# Patient Record
Sex: Female | Born: 1985 | Race: Black or African American | Hispanic: No | Marital: Single | Smoking: Never smoker
Health system: Southern US, Community
[De-identification: ages and names within clinical notes are randomized; demographics above are authoritative.]

## PROBLEM LIST (undated history)

## (undated) DIAGNOSIS — F419 Anxiety disorder, unspecified: Secondary | ICD-10-CM

## (undated) DIAGNOSIS — I10 Essential (primary) hypertension: Secondary | ICD-10-CM

## (undated) HISTORY — DX: Anxiety disorder, unspecified: F41.9

## (undated) HISTORY — PX: CHOLECYSTECTOMY: SHX55

## (undated) HISTORY — PX: BREAST SURGERY: SHX581

## (undated) HISTORY — DX: Essential (primary) hypertension: I10

---

## 2010-01-13 ENCOUNTER — Emergency Department (HOSPITAL_COMMUNITY): Admission: EM | Admit: 2010-01-13 | Discharge: 2010-01-14 | Payer: Self-pay | Admitting: Emergency Medicine

## 2010-03-11 ENCOUNTER — Ambulatory Visit
Admission: RE | Admit: 2010-03-11 | Discharge: 2010-03-12 | Payer: Self-pay | Source: Home / Self Care | Attending: Specialist | Admitting: Specialist

## 2010-05-02 ENCOUNTER — Ambulatory Visit (HOSPITAL_COMMUNITY)
Admission: EM | Admit: 2010-05-02 | Discharge: 2010-05-03 | Disposition: A | Discharge status: Home / Self Care | Payer: BC Managed Care – PPO | Source: Ambulatory Visit | Attending: General Surgery | Admitting: General Surgery

## 2010-05-02 ENCOUNTER — Emergency Department (HOSPITAL_COMMUNITY): Payer: BC Managed Care – PPO

## 2010-05-02 ENCOUNTER — Other Ambulatory Visit: Payer: Self-pay | Admitting: General Surgery

## 2010-05-02 DIAGNOSIS — Z01812 Encounter for preprocedural laboratory examination: Secondary | ICD-10-CM | POA: Insufficient documentation

## 2010-05-02 DIAGNOSIS — I10 Essential (primary) hypertension: Secondary | ICD-10-CM | POA: Insufficient documentation

## 2010-05-02 DIAGNOSIS — K810 Acute cholecystitis: Secondary | ICD-10-CM | POA: Insufficient documentation

## 2010-05-02 LAB — CBC
HCT: 38.7 % (ref 36.0–46.0)
Hemoglobin: 12.7 g/dL (ref 12.0–15.0)
MCH: 26.6 pg (ref 26.0–34.0)
MCHC: 32.8 g/dL (ref 30.0–36.0)
MCV: 81.1 fL (ref 78.0–100.0)
RDW: 12.9 % (ref 11.5–15.5)

## 2010-05-02 LAB — URINE MICROSCOPIC-ADD ON

## 2010-05-02 LAB — COMPREHENSIVE METABOLIC PANEL
ALT: 21 U/L (ref 0–35)
AST: 19 U/L (ref 0–37)
Albumin: 3.6 g/dL (ref 3.5–5.2)
Calcium: 9.1 mg/dL (ref 8.4–10.5)
Chloride: 103 mEq/L (ref 96–112)
Creatinine, Ser: 0.8 mg/dL (ref 0.4–1.2)
GFR calc Af Amer: >60 mL/min (ref >60)
Sodium: 136 mEq/L (ref 135–145)
Total Bilirubin: 0.4 mg/dL (ref 0.3–1.2)

## 2010-05-02 LAB — URINALYSIS, ROUTINE W REFLEX MICROSCOPIC
Bilirubin Urine: NEGATIVE
Hgb urine dipstick: NEGATIVE
Ketones, ur: 15 mg/dL — AB
Protein, ur: NEGATIVE mg/dL
Urine Glucose, Fasting: NEGATIVE mg/dL

## 2010-05-02 LAB — DIFFERENTIAL
Eosinophils Relative: 2 % (ref 0–5)
Lymphocytes Relative: 27 % (ref 12–46)
Lymphs Abs: 3.2 10*3/uL (ref 0.7–4.0)
Monocytes Absolute: 0.8 10*3/uL (ref 0.1–1.0)
Monocytes Relative: 7 % (ref 3–12)
Neutro Abs: 7.7 10*3/uL (ref 1.7–7.7)

## 2010-05-13 NOTE — Op Note (Signed)
NAMEVIOLET, SEABURY          ACCOUNT NO.:  [id number]  MEDICAL RECORD NO.:  [id number]           PATIENT TYPE:  I  LOCATION:  5124                         FACILITY:  [facility]  PHYSICIAN:  Mary Sella. Andrey Campanile, MD     DATE OF BIRTH:  12-19-1985  DATE OF PROCEDURE:  05/02/2010 DATE OF DISCHARGE:                              OPERATIVE REPORT   PREOPERATIVE DIAGNOSIS:  Acute cholecystitis.  POSTOPERATIVE DIAGNOSIS:  Acute cholecystitis.  PROCEDURE:  Laparoscopic cholecystectomy with intraoperative cholangiogram.  SURGEON:  Mary Sella. Andrey Campanile, MD  ASSISTANT SURGEON:  None.  ANESTHESIA:  General plus local consisting of 0.25% Marcaine with epi.  SPECIMEN:  Gallbladder.  ESTIMATED BLOOD LOSS:  Minimal.  FINDINGS:  The patient had acutely inflamed, thick-walled gallbladder. I was actually unable to aspirate it, the wall was so thick.  The critical view was obtained.  The cholangiogram demonstrated prompt filling of the cystic common bile duct, common hepatic duct, left to right hepatic duct, and prompt emptying into the duodenum.  There was no filling defect.  Because there was a little bit of oozing from the gallbladder fossa, which was controlled with electrocautery, I did elect to leave a piece of surgical snow in the gallbladder fossa.  INDICATIONS FOR PROCEDURE:  The patient is a 25 year old African female with known gallstones.  He had actually been seen in our office back in the fall, but delayed having surgery due to bilateral breast reduction. She developed right upper quadrant and epigastric pain on Sunday evening and some nausea, however, her pain resolved.  She was able to tolerate a diet on Monday and Tuesday; however, she said she had reflux. Apparently the pain came back on late yesterday evening and was persistent and had severe nausea.  Her pain did not go away after several hours, so it prompted her to come to the emergency room.  She denied any fevers or chills.   She had a mild white blood cell count, but her LFTs were normal.  There is no evidence of choledocholithiasis.  I recommended a laparoscopic cholecystectomy with cholangiogram, possible open.  We discussed the risks and benefits of surgery including bleeding, infection, injury to surrounding structures, prolonged diarrhea, need for an open procedure, bile leak, incisional hernia, DVT occurrence, and injury to common bile duct requiring major reconstructive bile duct surgery.  She elects to proceed to surgery.  DESCRIPTION OF PROCEDURE:  After obtaining informed consent, the patient was brought to the operating room, placed supine on the operating room table.  General endotracheal anesthesia was established.  Sequential compression devices were placed.  She received antibiotics prior to skin incision.  A surgical time-out was performed.  Her abdomen was then prepped and draped in usual standard surgical fashion.  Local was infiltrated at the base of her umbilicus.  Next, an 1-inch infraumbilical incision was made with a #11 blade.  The subcutaneous tissue was spread.  The fascia was grasped with Kochers x2 and lifted anteriorly.  The fascia was incised with #11 blade.  The abdominal cavity was entered.  A pursestring suture consisting of 0-Vicryl on UR-6 needle was placed around the fascial edges.  Next, the Hasson trocar was placed under direct visualization into the abdominal cavity. Pneumoperitoneum was smoothly established up to a patient pressure of 15 mmHg.  Laparoscope was advanced and the abdominal cavity was surveilled. She had a few omental adhesions in her left upper quadrant to her abdominal wall.  I elected we would leave these alone.  She was then placed in reverse Trendelenburg and rotated slightly to the left.  Three 5-mm trocars were placed, one in the subxiphoid and two in the right hypochondrium; all under direct visualization after local had been infiltrated.  She had  some omentum, which was adhered to the body of her gallbladder.  This was stripped away with both blunt dissection as well as with hook electrocautery.  The gallbladder was very thick-walled and edematous.  It was actually somewhat difficult to grasp and retracted up towards the right shoulder.  I did try to aspirate it twice, but was unsuccessful in piercing the gallbladder wall.  Nonetheless, I was able to get a ratcheted grasper on it and lifted up to the right shoulder. The neck was grasped and retracted laterally.  I then used hook electrocautery to incise the peritoneum both medially and laterally to help with retraction and visualization.  The cystic duct was circumferentially dissected around with a Catha Nottingham and the cystic artery was also circumferentially dissected out around.  Some overlying inflamed peritoneum was stripped down with the suction irrigator catheter as well as with the Teaching laboratory technician.  The critical view was obtained.  I did see the liver between the cystic duct and the cystic artery.  One clip was placed on the cystic duct distally.  It was then partially transected with Endoshears.  The cholangiogram catheter was introduced through the abdominal wall and secured into the duct with a clip.  The cholangiogram was performed as described above.  Once this was done, the C-arm machine was removed, the clip was removed from the cystic duct, and the cholangiogram catheter was removed.  Three clips were placed on the downside the cystic duct and then it was transected with Endoshears.  Two clips were placed on the downside of the cystic artery and one distally.  It was then transected with Endoshears.  We then rolled the gallbladder up at the gallbladder fossa.  This was done using hook electrocautery.  The liver capsule was violated a couple of times because there was a really not a too good of a plane between the posterior wall of the gallbladder and the  liver.  However, we were eventually able to free the gallbladder from the gallbladder fossa. Care was placed in the subxiphoid trocar.  The endobag was advanced through the umbilical trocar.  The specimen was placed in the endobag and it was brought out through the umbilical trocar site. Pneumoperitoneum was reestablished.  The liver was lifted up. Hemostasis was achieved with electrocautery.  I irrigated the right upper quadrant with 1 L saline.  There was no evidence of bile leak.  There is no evidence of ongoing bleeding.  I did elect to place a piece of surgical snow.  The camera was placed in the subxiphoid trocar and the umbilical trocar was removed.  The previously placed pursestring suture was tied down thus obliterating the fascial defect.  There was no air leak.  There was nothing within our fascial closure.  Pneumoperitoneum was released and all trocars were removed.  Skin incisions were closed with 4-0 Monocryl in a subcuticular fashion followed by the  application of Octylseal adhesive.  The patient was extubated and taken to the recovery room in a stable condition.  There were no immediate complications.  The patient tolerated the procedure well.  All needle and sponge counts were correct x2.     Mary Sella. Andrey Campanile, MDEMW/MEDQ  D:  05/02/2010  T:  05/03/2010  Job:  [id number]  cc:   Lennie Muckle, MD  Electronically Signed by Gaynelle Adu M.D. on 05/13/2010 08:34:43 AM

## 2010-06-02 NOTE — H&P (Signed)
[name], [name]          ACCOUNT NO.:  [id number]  MEDICAL RECORD NO.:  [id number]           PATIENT TYPE:  I  LOCATION:  5124                         FACILITY:  [facility]  PHYSICIAN:  Mary Sella. Andrey Campanile, MD     DATE OF BIRTH:  December 19, 1985  DATE OF ADMISSION:  05/02/2010 DATE OF DISCHARGE:                             HISTORY & PHYSICAL   PRIMARY CARE PHYSICIAN:  Renaye Rakers, MD  CHIEF COMPLAINT:  Abdominal pain.  HISTORY OF PRESENT ILLNESS:  Cynthia Vance is a very pleasant 25 year old black female with a history of hypertension and genital herpes simplex who had a gallbladder attack in October 2011.  She presented to the emergency department and had ultrasound which revealed gallstones.  She was sent to our office for follow up.  She did see Dr. Freida Busman in our office and it was recommended at that time that she have surgery; however, it was agreed upon to wait as she was having a breast reduction in December.  She did have this done and this past Sunday the patient said she had another gallbladder attack.  She says she has felt "weird" since with some dizziness and just an altered sense of being.  She did, however, improve.  However, on Wednesday she began having some right upper quadrant pain which radiated through to her back.  She also developed associated nausea and some emesis.  This has been her biggest complaint, persistent nausea.  She presented back to the emergency department overnight, at which time she had an ultrasound which revealed cholelithiasis with gallbladder wall thickening and presence of a sonographic Murphy sign.  No biliary dilatation was seen.  She was also found to have a white blood cell count of 11,900; however, all of her LFTs were normal.  At this time, we were asked to evaluate the patient for surgical admission.  REVIEW OF SYSTEMS:  Please see HPI, otherwise all other systems have been reviewed and are negative.  PAST MEDICAL HISTORY: 1.  Hypertension. 2. Genital herpes.  PAST SURGICAL HISTORY:  Breast reduction.  SOCIAL HISTORY:  The patient is single.  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  She denies any tobacco and rarely consumes any alcohol.  Otherwise, no illicit drug abuse.  ALLERGIES:  NKDA.  Medications include: 1. Lisinopril/hydrochlorothiazide 20/12.5 mg daily. 2. Valtrex.  PHYSICAL EXAMINATION:  GENERAL:  Cynthia Vance is a very pleasant 25 year old black female who is currently lying in bed in no acute distress but otherwise well developed and well nourished. HEENT:  Head is normocephalic, atraumatic.  Sclerae noninjected.  Pupils are equal, round, and reactive to light.  Ears and nose without any obvious masses or lesions.  No rhinorrhea.  Mouth is pink.  Throat shows no exudate. HEART:  Regular rate and rhythm.  Normal S1 and S2.  No murmurs, gallops, or rubs are noted.  She does have palpable carotid, radial, and pedal pulses bilaterally. LUNGS:  Clear to auscultation bilaterally with no wheezes, rhonchi, or rales noted.  Respiratory effort is nonlabored. ABDOMEN:  Soft, tender in the right upper quadrant with a positive Murphy sign.  She has hypoactive bowel sounds and is otherwise nondistended.  She does not have any hernias, masses, or organomegaly noted. MUSCULOSKELETAL:  All four extremities are symmetrical with no cyanosis, clubbing, or edema. NEUROLOGIC:  Cranial nerves II through XII appear to be grossly intact. Deep tendon reflex exam is deferred at this time. PSYCHIATRIC:  The patient was alert and oriented x3 with an appropriate affect.  LABORATORY DATA AND DIAGNOSTICS:  Sodium 136, potassium 3.4, glucose 133, BUN 11, creatinine 0.80, total bilirubin 0.4, alkaline phosphatase 44, AST 19, ALT 21, lipase is 21.  White blood cell count 11,900, hemoglobin 12.7, hematocrit 38.7, platelet count is 286,000.  Ultrasound reveals cholelithiasis with  gallbladder wall thickening and a positive sonographic Murphy sign consistent with acute cholecystitis.  IMPRESSION: 1. Acute cholecystitis. 2. History of hypertension. 3. History of genital herpes.  PLANS:  At this time, we will get the patient admitted.  We will start her on IV fluids as well as IV Unasyn.  We will plan for a laparoscopic cholecystectomy today by Dr. Andrey Campanile.  I have explained the procedure to the patient as well as potential risks and complications including bleeding, infection, common duct injury, or injury to surrounding bowel or organs.  I have discussed the complications including a bile leak as well.  The patient and her mom who is present with her understand and wish to proceed.     Letha Cape, PA   ______________________________ Mary Sella. Andrey Campanile, MD    KEO/MEDQ  D:  05/02/2010  T:  05/02/2010  Job:  [id number]  cc:   Renaye Rakers, M.D.  Electronically Signed by Barnetta Chapel PA on 05/17/2010 07:06:37 AM Electronically Signed by Gaynelle Adu M.D. on 06/02/2010 09:34:38 AM

## 2010-06-03 LAB — DIFFERENTIAL
Basophils Absolute: 0 K/uL (ref 0.0–0.1)
Basophils Relative: 0 % (ref 0–1)
Eosinophils Absolute: 0.2 K/uL (ref 0.0–0.7)
Eosinophils Relative: 2 % (ref 0–5)
Lymphocytes Relative: 48 % — ABNORMAL HIGH (ref 12–46)
Lymphs Abs: 3.3 K/uL (ref 0.7–4.0)
Monocytes Absolute: 0.6 K/uL (ref 0.1–1.0)
Monocytes Relative: 9 % (ref 3–12)
Neutro Abs: 2.8 K/uL (ref 1.7–7.7)
Neutrophils Relative %: 41 % — ABNORMAL LOW (ref 43–77)

## 2010-06-03 LAB — BASIC METABOLIC PANEL WITH GFR
BUN: 7 mg/dL (ref 6–23)
CO2: 26 meq/L (ref 19–32)
Calcium: 9 mg/dL (ref 8.4–10.5)
Chloride: 106 meq/L (ref 96–112)
Creatinine, Ser: 0.65 mg/dL (ref 0.4–1.2)
GFR calc Af Amer: >60 mL/min (ref >60)
GFR calc non Af Amer: >60 mL/min (ref >60)
Glucose, Bld: 108 mg/dL — ABNORMAL HIGH (ref 70–99)
Potassium: 4.1 meq/L (ref 3.5–5.1)
Sodium: 137 meq/L (ref 135–145)

## 2010-06-03 LAB — CBC
Hemoglobin: 13.1 g/dL (ref 12.0–15.0)
MCH: 26 pg (ref 26.0–34.0)
MCV: 82.1 fL (ref 78.0–100.0)
RBC: 5.04 MIL/uL (ref 3.87–5.11)

## 2010-06-05 LAB — COMPREHENSIVE METABOLIC PANEL
Albumin: 3.8 g/dL (ref 3.5–5.2)
BUN: 8 mg/dL (ref 6–23)
Chloride: 101 mEq/L (ref 96–112)
Creatinine, Ser: 0.84 mg/dL (ref 0.4–1.2)
Total Bilirubin: 0.4 mg/dL (ref 0.3–1.2)
Total Protein: 7.6 g/dL (ref 6.0–8.3)

## 2010-06-05 LAB — CBC
MCH: 27 pg (ref 26.0–34.0)
MCV: 81.9 fL (ref 78.0–100.0)
Platelets: 296 10*3/uL (ref 150–400)
RDW: 13 % (ref 11.5–15.5)

## 2010-06-05 LAB — URINALYSIS, ROUTINE W REFLEX MICROSCOPIC
Hgb urine dipstick: NEGATIVE
Nitrite: NEGATIVE
Specific Gravity, Urine: 1.019 (ref 1.005–1.030)
Urobilinogen, UA: 1 mg/dL (ref 0.0–1.0)

## 2010-06-05 LAB — DIFFERENTIAL
Basophils Absolute: 0 10*3/uL (ref 0.0–0.1)
Lymphocytes Relative: 39 % (ref 12–46)
Monocytes Absolute: 0.7 10*3/uL (ref 0.1–1.0)
Neutro Abs: 7.1 10*3/uL (ref 1.7–7.7)

## 2010-06-05 LAB — POCT PREGNANCY, URINE: Preg Test, Ur: NEGATIVE

## 2010-06-05 LAB — GC/CHLAMYDIA PROBE AMP, GENITAL: GC Probe Amp, Genital: NEGATIVE

## 2010-06-05 LAB — LIPASE, BLOOD: Lipase: 23 U/L (ref 11–59)

## 2010-07-17 ENCOUNTER — Other Ambulatory Visit: Payer: Self-pay | Admitting: Obstetrics & Gynecology

## 2010-09-30 ENCOUNTER — Ambulatory Visit
Admission: RE | Admit: 2010-09-30 | Discharge: 2010-09-30 | Disposition: A | Discharge status: Home / Self Care | Payer: BC Managed Care – PPO | Source: Ambulatory Visit | Attending: Family Medicine | Admitting: Family Medicine

## 2010-09-30 ENCOUNTER — Other Ambulatory Visit: Payer: Self-pay | Admitting: Family Medicine

## 2010-09-30 DIAGNOSIS — T148XXA Other injury of unspecified body region, initial encounter: Secondary | ICD-10-CM

## 2011-06-16 IMAGING — RF DG CHOLANGIOGRAM OPERATIVE
1 series · 5 of 5 positions shown · non-contrast
Comparison: Ultrasound 05/02/2010

CLINICAL DATA: Laparoscopic cholecystectomy

INTRAOPERATIVE CHOLANGIOGRAM
TECHNIQUE: Cholangiographic images from the C-arm fluoroscopic
device were submitted for interpretation post-operatively.  Please
see the procedural report for the amount of contrast and the
fluoroscopy time utilized.

[Series 1: run · 2 acquisitions, 5 frames shown]
[im 1/2]
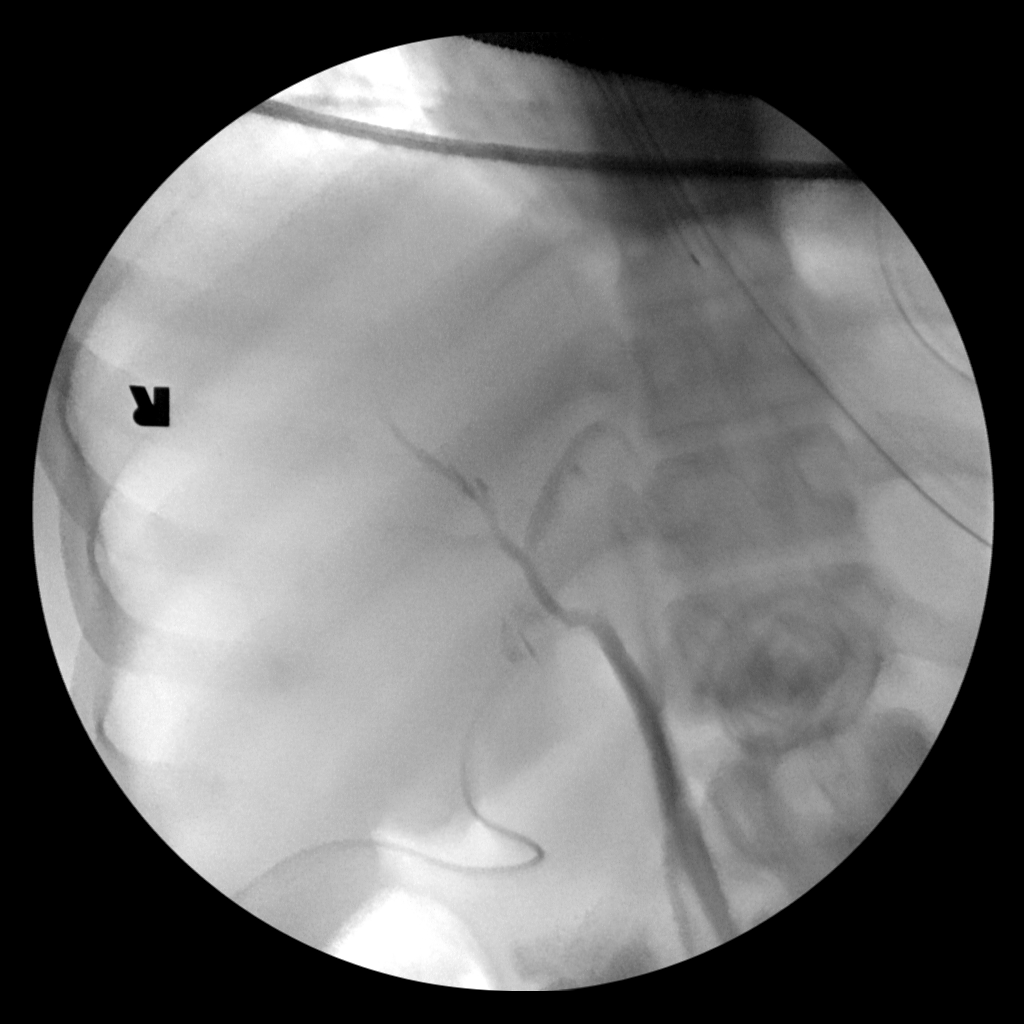
[im 1/2]
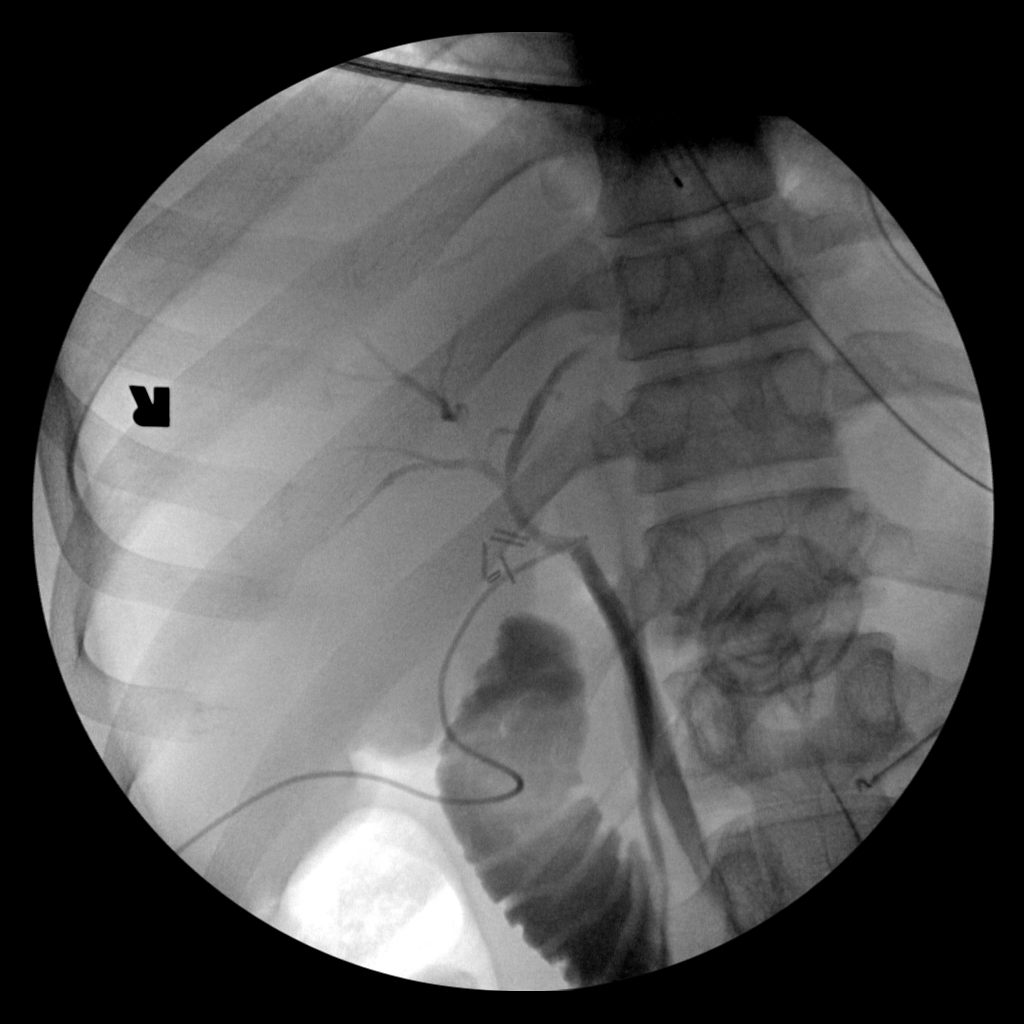
[im 1/2]
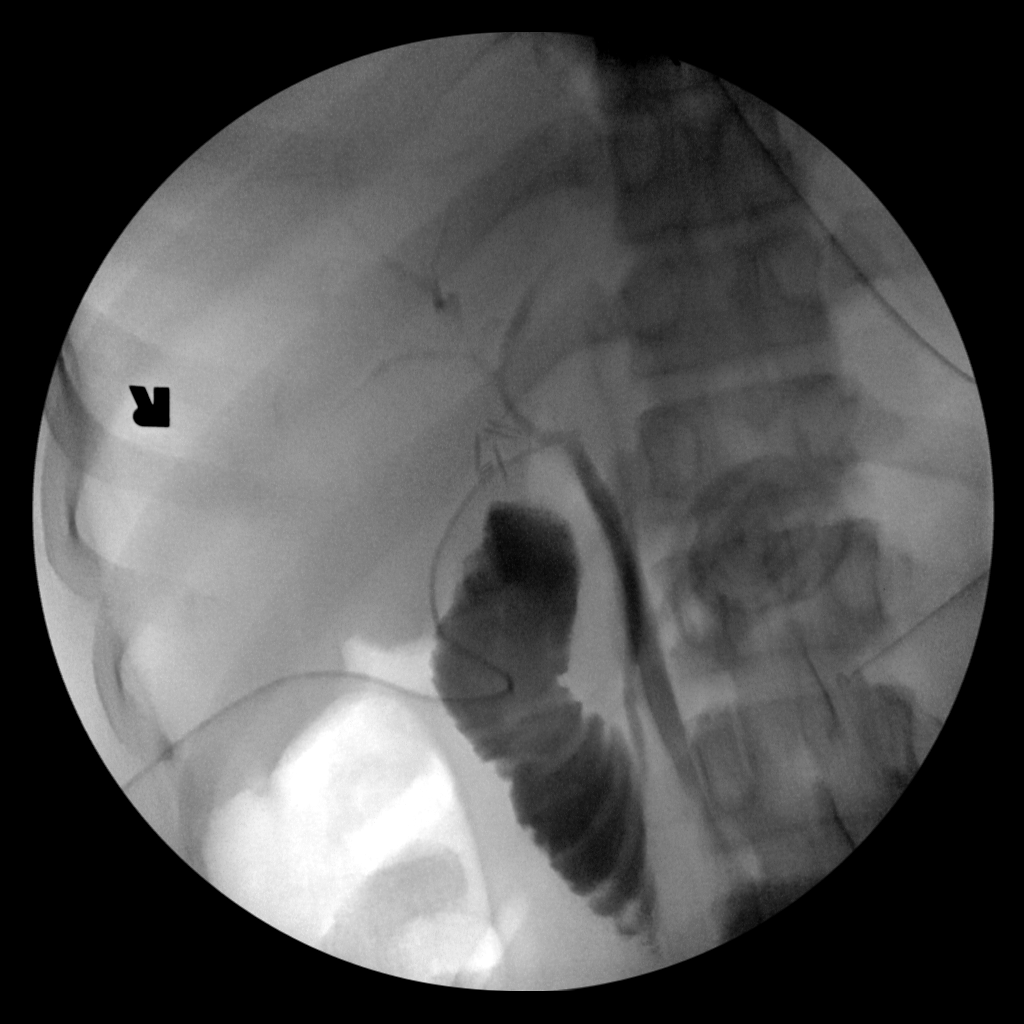
[im 1/2]
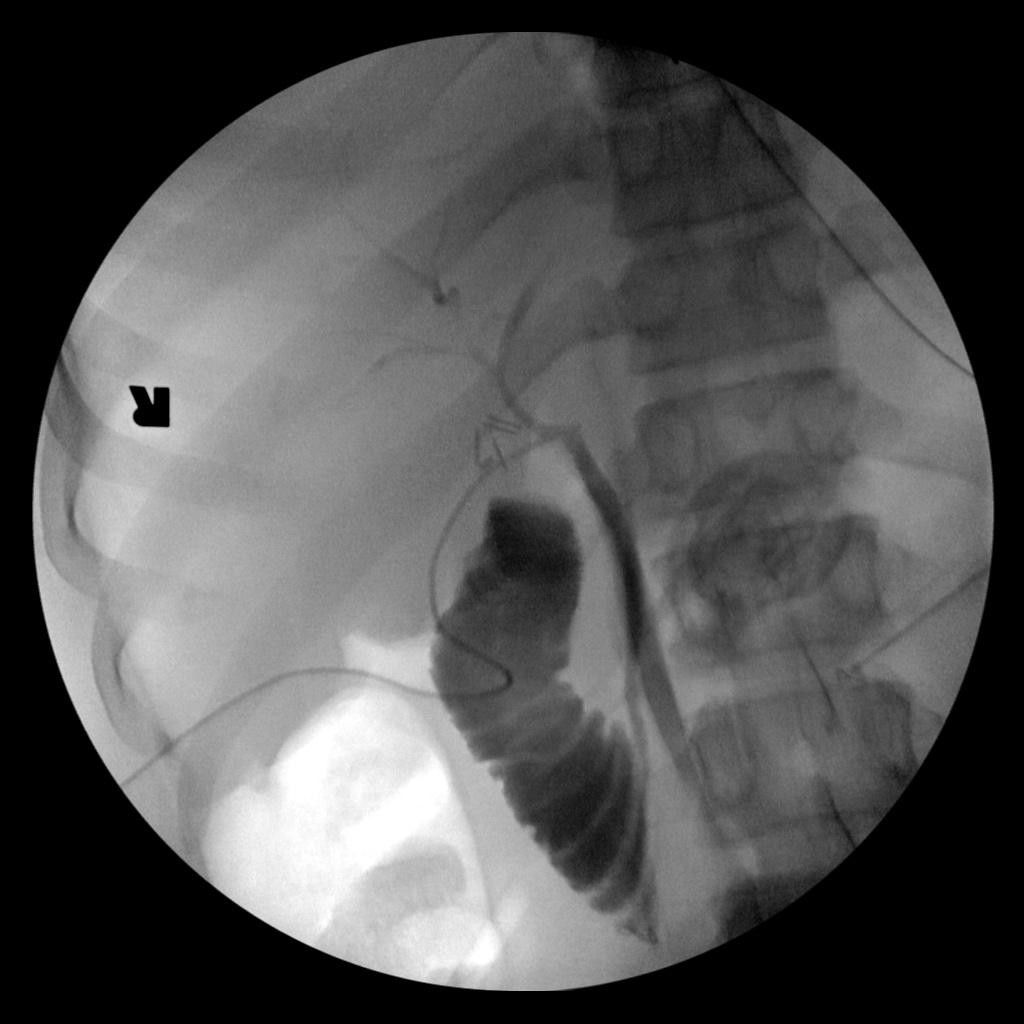
[im 2/2]
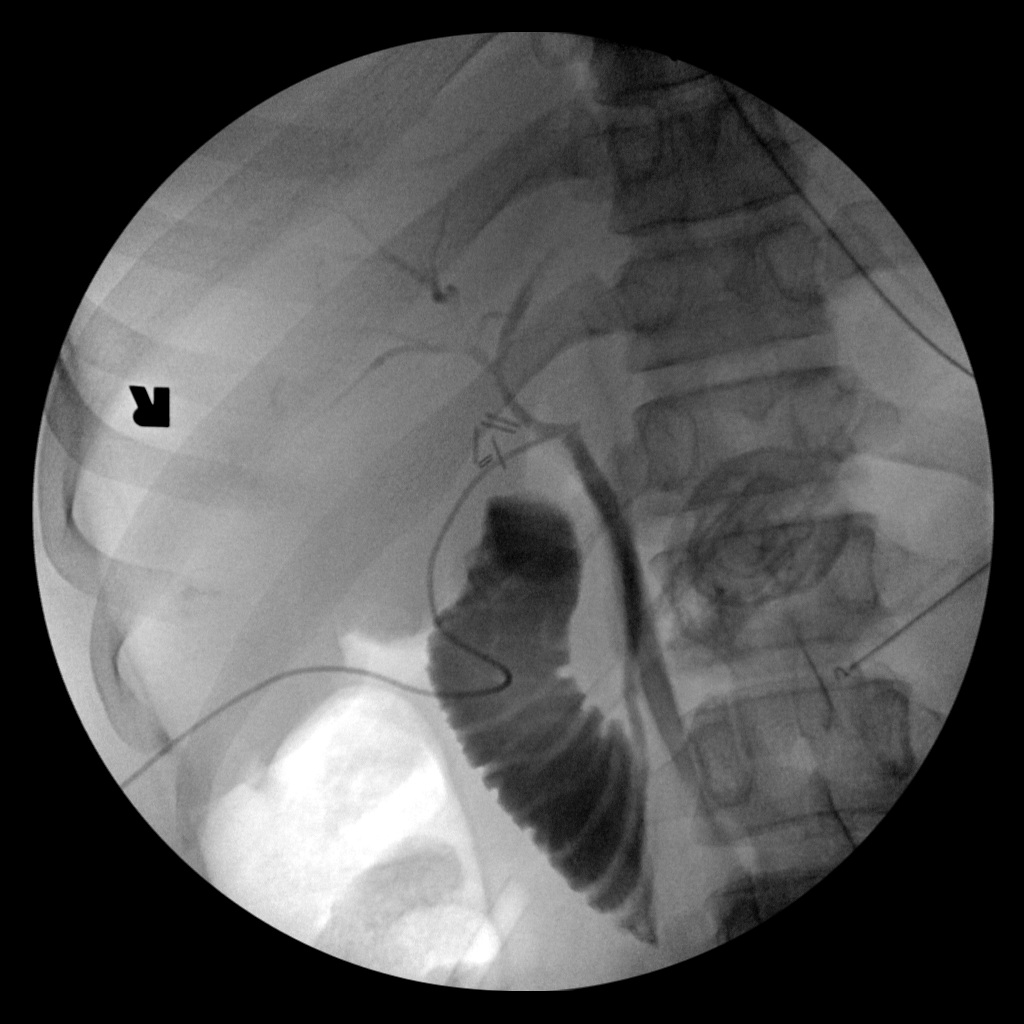

[5 of 5 positions shown; findings below may reference images not displayed]

FINDINGS: Intraoperative fluoroscopic images demonstrate
opacification of the normal caliber common duct and proximal
intrahepatic ducts with prompt excretion into the duodenum.  No
filling defect is seen to suggest choledocholithiasis.
Cholecystectomy clips noted.
IMPRESSION: Normal caliber visualized intra and extrahepatic bile ducts without
filling defect to suggest retained stone.

## 2011-06-16 IMAGING — US US ABDOMEN COMPLETE
1 series · 14 of 25 positions shown · non-contrast
Comparison: 01/13/2010

CLINICAL DATA: Abdominal pain

ULTRASOUND ABDOMEN:
TECHNIQUE: Sonography of upper abdominal structures was performed.

[Series 1: us abdomen complete · 0.30mm/px · 14 of 44 slices shown]
[im 1/44]
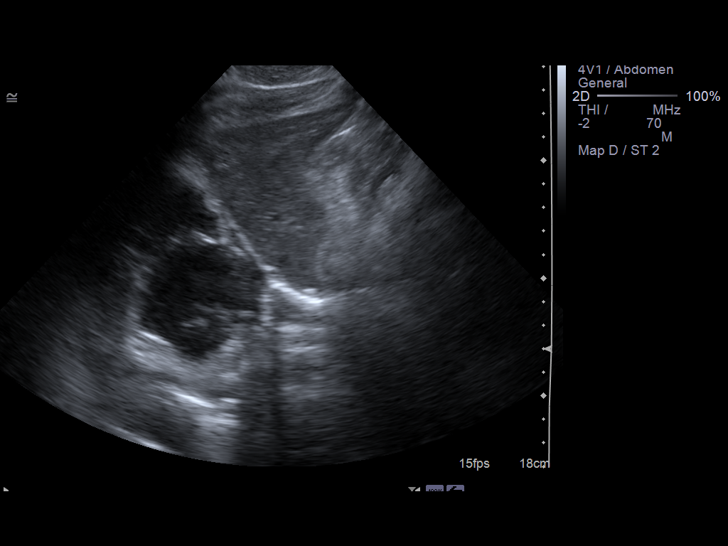
[im 4/44]
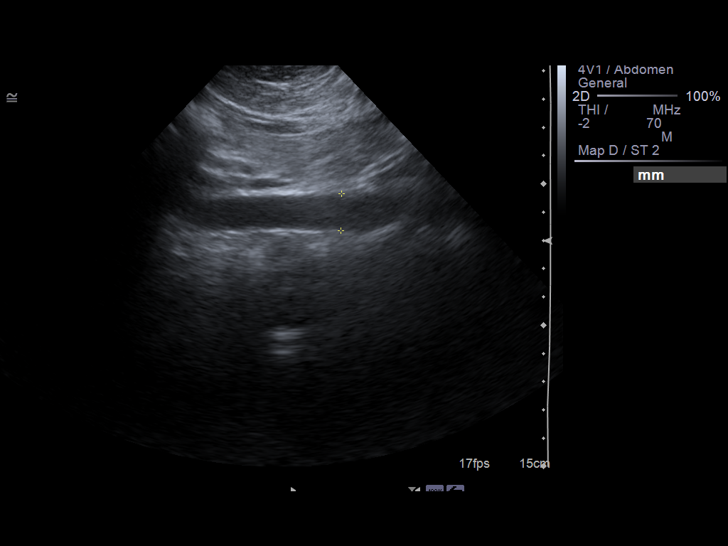
[im 8/44]
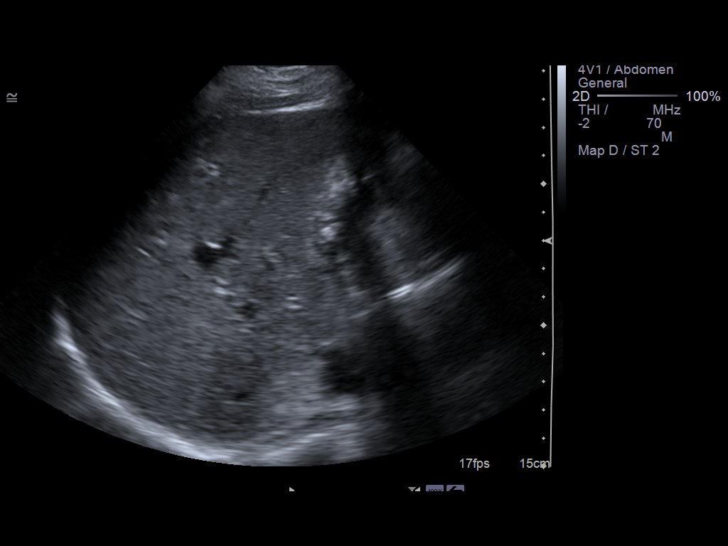
[im 11/44]
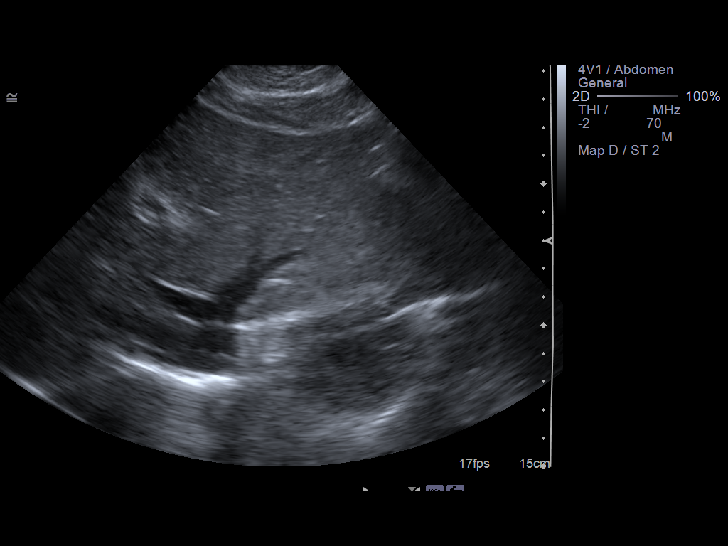
[im 15/44]
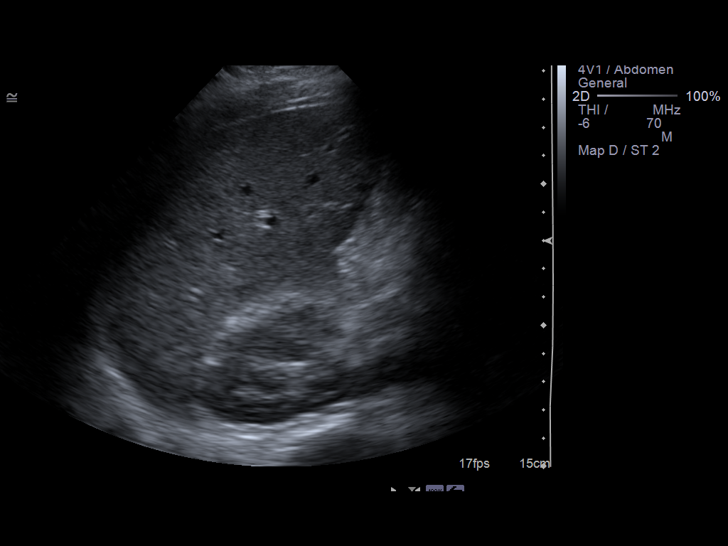
[im 17/44]
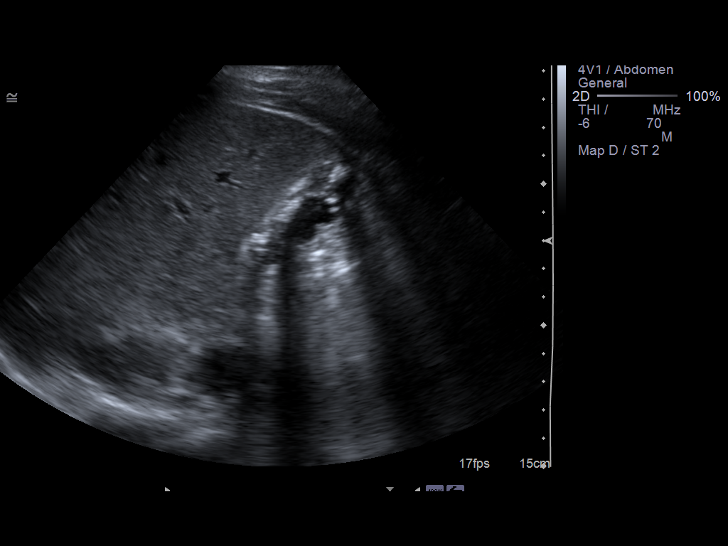
[im 20/44]
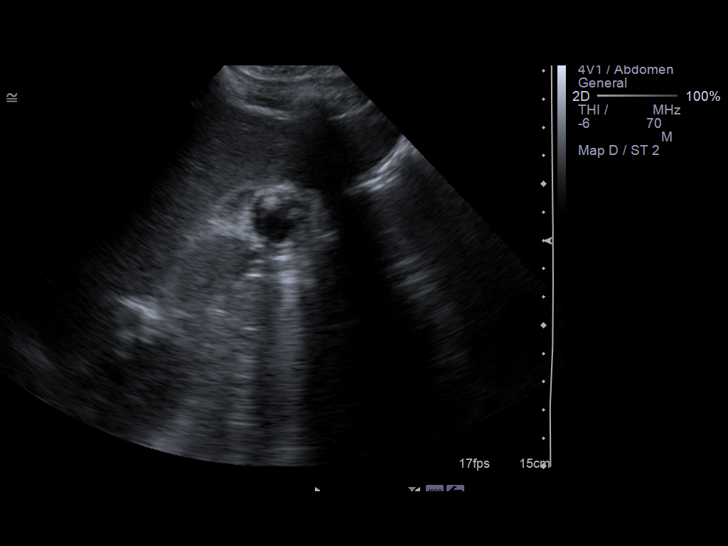
[im 24/44]
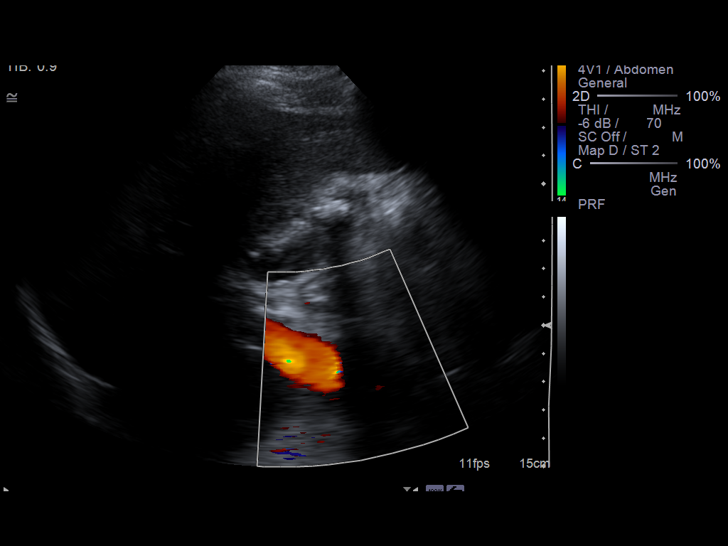
[im 27/44]
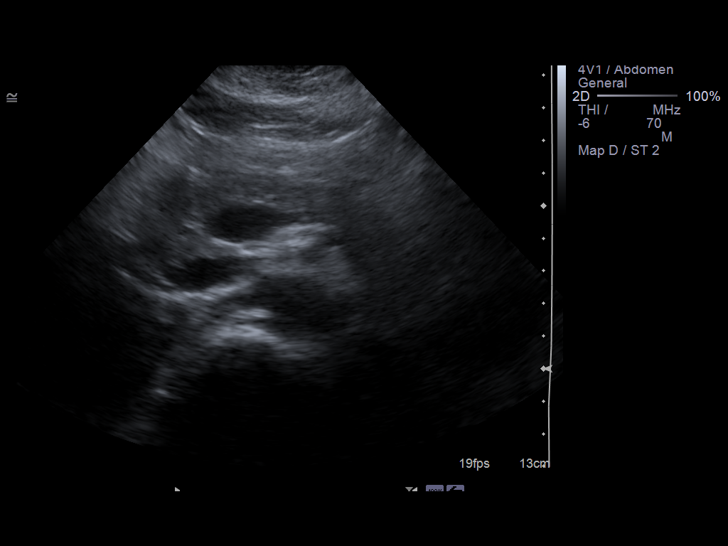
[im 29/44]
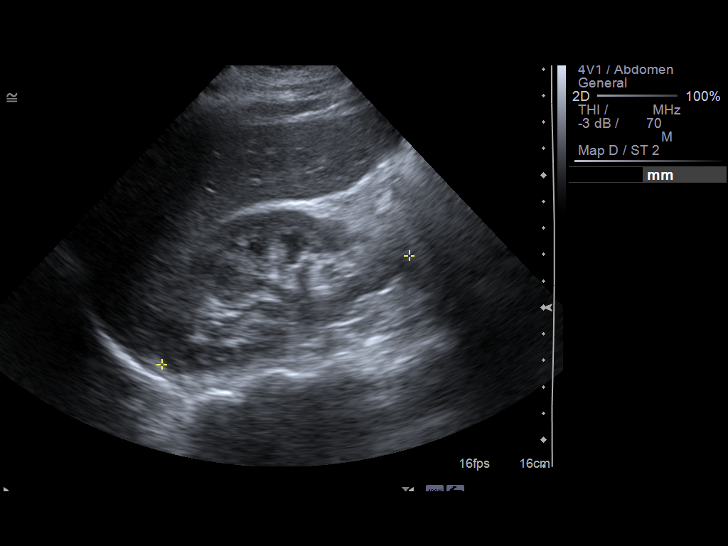
[im 33/44]
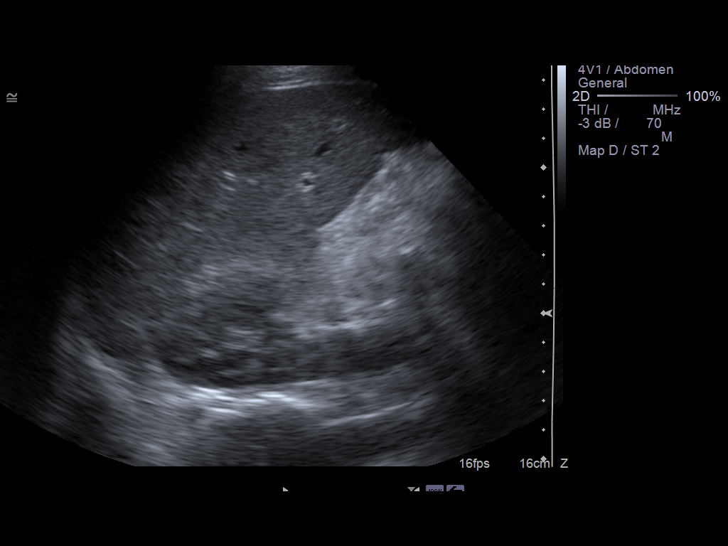
[im 36/44]
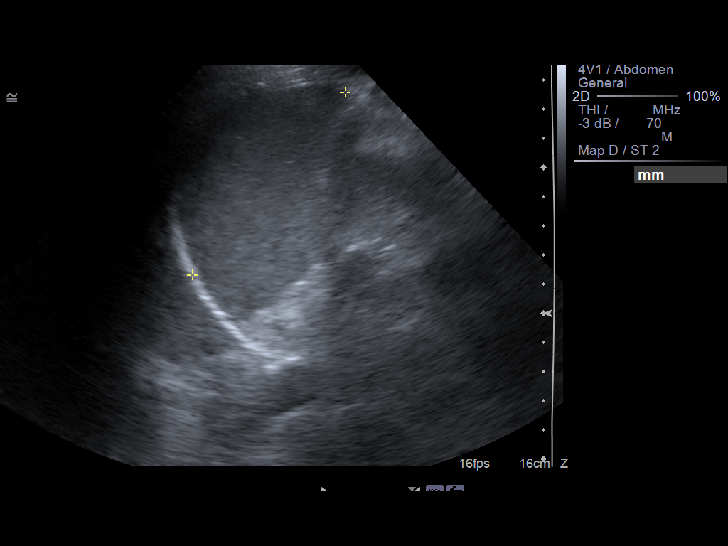
[im 40/44]
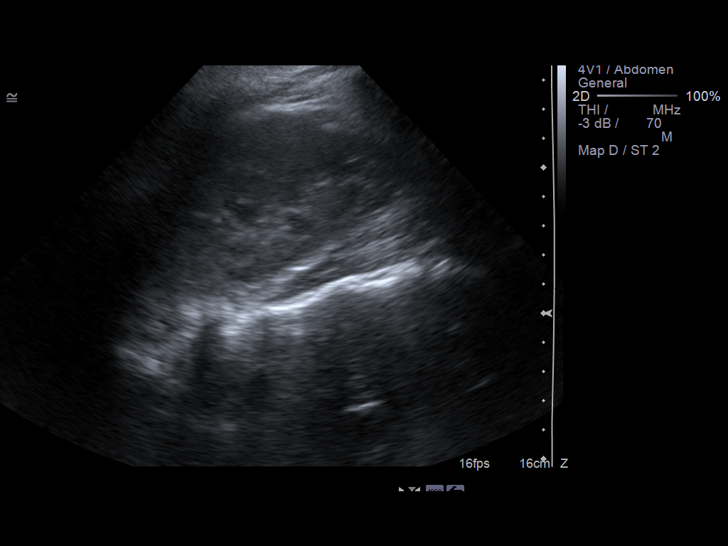
[im 44/44]
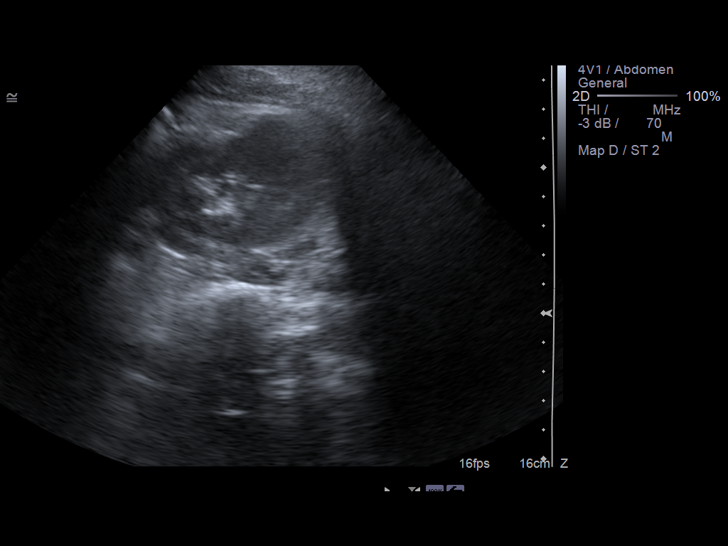

[14 of 25 positions shown; findings below may reference images not displayed]

Gallbladder:  Multiple calculi in gallbladder up to 9 mm diameter.
Irregular  thickening of gallbladder wall.  Sonographic Murphy's
sign present.  Findings suspicious for acute cholecystitis.  No
definite pericholecystic fluid.

Common bile duct:  Normal caliber 4 mm diameter.

Liver:  Normal appearance

IVC:  Unremarkable

Pancreas:  Incompletely visualized due to bowel gas, with
visualized portions of body and proximal tail normal appearance and
remainder obscured.

Spleen:  Unremarkable, 8.2 cm length.

Right kidney:  10.2 cm length. Normal morphology without mass or
hydronephrosis.

Left kidney:  11.0 cm length. Normal morphology without mass or
hydronephrosis.

Aorta:  Unremarkable

Other:  No free fluid
IMPRESSION: Cholelithiasis with gallbladder wall thickening and presence of a
sonographic Murphy's sign consistent with acute cholecystitis.
No biliary dilatation identified.
Incomplete pancreatic visualization.

## 2012-11-05 ENCOUNTER — Other Ambulatory Visit: Payer: Self-pay

## 2013-10-16 ENCOUNTER — Ambulatory Visit (INDEPENDENT_AMBULATORY_CARE_PROVIDER_SITE_OTHER): Payer: BC Managed Care – PPO | Admitting: Emergency Medicine

## 2013-10-16 VITALS — BP 110/62 | HR 74 | Temp 98.2°F | Resp 16 | Ht 63.0 in | Wt 162.4 lb

## 2013-10-16 DIAGNOSIS — J029 Acute pharyngitis, unspecified: Secondary | ICD-10-CM

## 2013-10-16 DIAGNOSIS — T7840XA Allergy, unspecified, initial encounter: Secondary | ICD-10-CM

## 2013-10-16 LAB — POCT RAPID STREP A (OFFICE): RAPID STREP A SCREEN: NEGATIVE

## 2013-10-16 MED ORDER — BETAMETHASONE DIPROPIONATE AUG 0.05 % EX CREA: TOPICAL_CREAM | Freq: Two times a day (BID) | CUTANEOUS | Status: AC

## 2013-10-16 NOTE — Progress Notes (Signed)
   Subjective:    Patient ID: Cynthia Vance, female    DOB: 02/27/1986, 28 y.o.   MRN: [id number] This chart was scribed for Collene GobbleSteven A Daub, MD by [place] Peace, ED Scribe. [facility] The patient's care was started at 9:05 AM.   HPI HPI Comments: Cynthia Vance is a 28 y.o. female who presents to the Emergency Department complaining of rash onset yesterday after getting her eyebrows done with associated pruritis, nasal congestion, and cough that she states started this morning. Pt reports that she has never previously been to that specific shop to get her eyebrows done. Pt reports that [place] is leaving tomorrow to go to [place] and wanted to get everything checked out before she left. Pt reports NKA and denies any similar occurrences in the past.    Review of Systems  HENT: Positive for congestion.   Respiratory: Positive for cough.   Skin: Positive for rash.       Redness.        Objective:   Physical Exam CONSTITUTIONAL: Well developed/well nourished HEAD: Normocephalic/atraumatic EYES: EOMI/PERRL ENMT: Mucous membranes moist NECK: supple no meningeal signs SPINE:entire spine nontender CV: S1/S2 noted, no murmurs/rubs/gallops noted LUNGS: Lungs are clear to auscultation bilaterally, no apparent distress ABDOMEN: soft, nontender, no rebound or guarding GU:no cva tenderness NEURO: Pt is awake/alert, moves all extremitiesx4 EXTREMITIES: pulses normal, full ROM SKIN: immaculate papular rash beneath both eyebrows.  PSYCH: no abnormalities of mood noted  Results for orders placed in visit on 10/16/13  POCT RAPID STREP A (OFFICE)      Result Value Ref Range   Rapid Strep A Screen Negative  Negative        Assessment & Plan:  9:09 AM- Treatment plan was discussed with patient who verbalizes understanding and agrees which includes steroid cream for the next couple days with Zyrtec or Claritin. I personally performed the services described in this  documentation, which was scribed in my presence. The recorded information has been reviewed and is accurate. Contact number will be her mom Gae GallopSharon Echeverri at [phone]

## 2013-10-18 LAB — CULTURE, GROUP A STREP: Organism ID, Bacteria: NORMAL

## 2013-11-07 ENCOUNTER — Other Ambulatory Visit: Payer: Self-pay

## 2013-11-08 LAB — CYTOLOGY - PAP

## 2015-07-04 ENCOUNTER — Other Ambulatory Visit: Payer: Self-pay | Admitting: Obstetrics & Gynecology

## 2015-07-05 LAB — CYTOLOGY - PAP

## 2018-12-07 DIAGNOSIS — I10 Essential (primary) hypertension: Secondary | ICD-10-CM | POA: Insufficient documentation

## 2023-03-10 ENCOUNTER — Telehealth: Payer: Self-pay | Admitting: Nurse Practitioner

## 2023-03-10 DIAGNOSIS — S01339A Puncture wound without foreign body of unspecified ear, initial encounter: Secondary | ICD-10-CM

## 2023-03-10 DIAGNOSIS — L089 Local infection of the skin and subcutaneous tissue, unspecified: Secondary | ICD-10-CM

## 2023-03-10 MED ORDER — MUPIROCIN 2 % EX OINT: 1.0000 | TOPICAL_OINTMENT | Freq: Two times a day (BID) | CUTANEOUS | 0 refills | Status: AC

## 2023-03-10 MED ORDER — FLUCONAZOLE 150 MG PO TABS: ORAL_TABLET | ORAL | 0 refills | Status: AC

## 2023-03-10 MED ORDER — SULFAMETHOXAZOLE-TRIMETHOPRIM 800-160 MG PO TABS: 1.0000 | ORAL_TABLET | Freq: Two times a day (BID) | ORAL | 0 refills | Status: AC

## 2023-03-10 NOTE — Addendum Note (Signed)
Addended by: Waldon Merl on: 03/10/2023 12:08 PM   Modules accepted: Orders

## 2023-03-10 NOTE — Progress Notes (Signed)
E-Visit for Cellulitis  We are sorry that you are not feeling well. Here is how we plan to help!  Based on what you shared with me it looks like you have cellulitis.  Cellulitis looks like areas of skin redness, swelling, and warmth; it develops as a result of bacteria entering under the skin. Little red spots and/or bleeding can be seen in skin, and tiny surface sacs containing fluid can occur. Fever can be present.   I have prescribed:  Bactrim DS 1 tablet by mouth twice a day for 7 days  We will also prescribe an antibiotic ointment you can use as well   Meds ordered this encounter  Medications   sulfamethoxazole-trimethoprim (BACTRIM DS) 800-160 MG tablet    Sig: Take 1 tablet by mouth 2 (two) times daily for 7 days.    Dispense:  14 tablet    Refill:  0   mupirocin ointment (BACTROBAN) 2 %    Sig: Apply 1 Application topically 2 (two) times daily.    Dispense:  22 g    Refill:  0    HOME CARE:  Take your medications as ordered and take all of them, even if the skin irritation appears to be healing.   GET HELP RIGHT AWAY IF:  Symptoms that don't begin to go away within 48 hours. Severe redness persists or worsens If the area turns color, spreads or swells. If it blisters and opens, develops yellow-brown crust or bleeds. You develop a fever or chills. If the pain increases or becomes unbearable.  Are unable to keep fluids and food down.  MAKE SURE YOU   Understand these instructions. Will watch your condition. Will get help right away if you are not doing well or get worse.  Thank you for choosing an e-visit.  Your e-visit answers were reviewed by a board certified advanced clinical practitioner to complete your personal care plan. Depending upon the condition, your plan could have included both over the counter or prescription medications.  Please review your pharmacy choice. Make sure the pharmacy is open so you can pick up prescription now. If there is a problem,  you may contact your provider through [employer] and have the prescription routed to another pharmacy.  Your safety is important to [place]. If you have drug allergies check your prescription carefully.   For the next 24 hours you can use MyChart to ask questions about today's visit, request a non-urgent call back, or ask for a work or school excuse. You will get an email in the next two days asking about your experience. I hope that your e-visit has been valuable and will speed your recovery.   I spent approximately 5 minutes reviewing the patient's history, current symptoms and coordinating their care today.
